# Patient Record
Sex: Male | Born: 1995 | Race: White | Hispanic: No | Marital: Married | Smoking: Never smoker
Health system: Southern US, Community
[De-identification: ages and names within clinical notes are randomized; demographics above are authoritative.]

## PROBLEM LIST (undated history)

## (undated) HISTORY — PX: WISDOM TOOTH EXTRACTION: SHX21

---
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
## 2016-12-21 ENCOUNTER — Ambulatory Visit (INDEPENDENT_AMBULATORY_CARE_PROVIDER_SITE_OTHER): Payer: BC Managed Care – PPO | Admitting: Sports Medicine

## 2016-12-21 ENCOUNTER — Encounter: Payer: Self-pay | Admitting: Sports Medicine

## 2016-12-21 DIAGNOSIS — F341 Dysthymic disorder: Secondary | ICD-10-CM | POA: Diagnosis not present

## 2016-12-21 DIAGNOSIS — Z113 Encounter for screening for infections with a predominantly sexual mode of transmission: Secondary | ICD-10-CM

## 2016-12-21 DIAGNOSIS — Z0000 Encounter for general adult medical examination without abnormal findings: Secondary | ICD-10-CM | POA: Diagnosis not present

## 2016-12-21 DIAGNOSIS — A5601 Chlamydial cystitis and urethritis: Secondary | ICD-10-CM | POA: Insufficient documentation

## 2016-12-21 MED ORDER — ESCITALOPRAM OXALATE 5 MG PO TABS: 5.0000 mg | ORAL_TABLET | Freq: Every day | ORAL | 3 refills | Status: DC

## 2016-12-21 NOTE — Assessment & Plan Note (Signed)
Adding a full STD screen. 

## 2016-12-21 NOTE — Assessment & Plan Note (Signed)
We discussed the pathophysiology mechanism of the medication and the disease process in detail. Starting Lexapro 5, return in one month for a repeat PTQ/GAD.

## 2016-12-21 NOTE — Progress Notes (Signed)
  Subjective:    CC: Establish care.   HPI:  Travis Ward is a pleasant 21 year old male, he is healthy, he has no complaints with the exception of mildly depressed mood without suicidal or homicidal ideation. He is interested in trying medication. Symptoms are mild, intermittent. In addition he is interested in being tested for STDs.  Past medical history:  Negative.  See flowsheet/record as well for more information.  Surgical history: Negative.  See flowsheet/record as well for more information.  Family history: Negative.  See flowsheet/record as well for more information.  Social history: Negative.  See flowsheet/record as well for more information.  Allergies, and medications have been entered into the medical record, reviewed, and no changes needed.    Review of Systems: No headache, visual changes, nausea, vomiting, diarrhea, constipation, dizziness, abdominal pain, skin rash, fevers, chills, night sweats, swollen lymph nodes, weight loss, chest pain, body aches, joint swelling, muscle aches, shortness of breath, mood changes, visual or auditory hallucinations.  Objective:    General: Well Developed, well nourished, and in no acute distress.  Neuro: Alert and oriented x3, extra-ocular muscles intact, sensation grossly intact.  HEENT: Normocephalic, atraumatic, pupils equal round reactive to light, neck supple, no masses, no lymphadenopathy, thyroid nonpalpable.  Skin: Warm and dry, no rashes noted.  Cardiac: Regular rate and rhythm, no murmurs rubs or gallops.  Respiratory: Clear to auscultation bilaterally. Not using accessory muscles, speaking in full sentences.  Abdominal: Soft, nontender, nondistended, positive bowel sounds, no masses, no organomegaly.  Musculoskeletal: Shoulder, elbow, wrist, hip, knee, ankle stable, and with full range of motion.  Impression and Recommendations:    The patient was counselled, risk factors were discussed, anticipatory guidance  given.  Dysthymia We discussed the pathophysiology mechanism of the medication and the disease process in detail. Starting Lexapro 5, return in one month for a repeat PTQ/GAD.  Annual physical exam Checking routine labs, up-to-date on tetanus vaccination, adding flu shot today.   Screening for STD (sexually transmitted disease) Adding a full STD screen.  ___________________________________________ Ihor Austinhomas J. Benjamin Stainhekkekandam, M.D., ABFM., CAQSM. Primary Care and Sports Medicine [facility]  Adjunct Instructor of Family Medicine  [employer]

## 2016-12-21 NOTE — Assessment & Plan Note (Signed)
Checking routine labs, up-to-date on tetanus vaccination, adding flu shot today.

## 2017-01-18 ENCOUNTER — Encounter: Payer: Self-pay | Admitting: Sports Medicine

## 2017-01-18 ENCOUNTER — Ambulatory Visit: Payer: BC Managed Care – PPO | Admitting: Sports Medicine

## 2017-01-18 DIAGNOSIS — A5601 Chlamydial cystitis and urethritis: Secondary | ICD-10-CM | POA: Diagnosis not present

## 2017-01-18 DIAGNOSIS — Z23 Encounter for immunization: Secondary | ICD-10-CM | POA: Diagnosis not present

## 2017-01-18 DIAGNOSIS — F341 Dysthymic disorder: Secondary | ICD-10-CM

## 2017-01-18 MED ORDER — ESCITALOPRAM OXALATE 10 MG PO TABS: 10.0000 mg | ORAL_TABLET | Freq: Every day | ORAL | 3 refills | Status: DC

## 2017-01-18 NOTE — Progress Notes (Addendum)
  Subjective:    CC: Dysthymia follow-up  HPI: This is a pleasant 21 year old male, he had some depressed mood, but not enough to qualify for major depression, we diagnosed him with dysthymia and started Lexapro per his request, 5 mg.  He returns today feeling may be slightly better but overall the same, no suicidal or homicidal ideation.  Preventive measures: Flu shot today, has not yet obtained his routine blood work.  Past medical history:  Negative.  See flowsheet/record as well for more information.  Surgical history: Negative.  See flowsheet/record as well for more information.  Family history: Negative.  See flowsheet/record as well for more information.  Social history: Negative.  See flowsheet/record as well for more information.  Allergies, and medications have been entered into the medical record, reviewed, and no changes needed.   Review of Systems: No fevers, chills, night sweats, weight loss, chest pain, or shortness of breath.   Objective:    General: Well Developed, well nourished, and in no acute distress.  Neuro: Alert and oriented x3, extra-ocular muscles intact, sensation grossly intact.  HEENT: Normocephalic, atraumatic, pupils equal round reactive to light, neck supple, no masses, no lymphadenopathy, thyroid nonpalpable.  Skin: Warm and dry, no rashes. Cardiac: Regular rate and rhythm, no murmurs rubs or gallops, no lower extremity edema.  Respiratory: Clear to auscultation bilaterally. Not using accessory muscles, speaking in full sentences.  Impression and Recommendations:    Dysthymia Slight persistence of dysthymia, and likely seasonal affective disorder. Increasing Lexapro to 10 mg, return in 1 month.  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  I spent 25 minutes with this patient, greater than 50% was face-to-face time  counseling regarding the above diagnoses ___________________________________________ Ihor Austinhomas J. Benjamin Stainhekkekandam, M.D., ABFM., CAQSM. Primary Care and Sports Medicine [facility]  Adjunct Instructor of Family Medicine  [employer]

## 2017-01-18 NOTE — Assessment & Plan Note (Signed)
Slight persistence of dysthymia, and likely seasonal affective disorder. Increasing Lexapro to 10 mg, return in 1 month.

## 2017-01-19 LAB — COMPREHENSIVE METABOLIC PANEL WITH GFR
AG Ratio: 2 (calc) (ref 1.0–2.5)
ALT: 10 U/L (ref 9–46)
AST: 14 U/L (ref 10–40)
CO2: 30 mmol/L (ref 20–32)
Calcium: 9.6 mg/dL (ref 8.6–10.3)
Chloride: 104 mmol/L (ref 98–110)
Globulin: 2.3 g/dL (ref 1.9–3.7)
Sodium: 141 mmol/L (ref 135–146)
Total Bilirubin: 0.7 mg/dL (ref 0.2–1.2)
Total Protein: 7 g/dL (ref 6.1–8.1)

## 2017-01-19 LAB — HEMOGLOBIN A1C
Hgb A1c MFr Bld: 4.9 % of total Hgb (ref <5.7)
Mean Plasma Glucose: 94 (calc)
eAG (mmol/L): 5.2 (calc)

## 2017-01-19 LAB — COMPREHENSIVE METABOLIC PANEL
Albumin: 4.7 g/dL (ref 3.6–5.1)
Alkaline phosphatase (APISO): 60 U/L (ref 40–115)
BUN: 16 mg/dL (ref 7–25)
Creat: 1.03 mg/dL (ref 0.60–1.35)
Glucose, Bld: 85 mg/dL (ref 65–99)
Potassium: 4.3 mmol/L (ref 3.5–5.3)

## 2017-01-19 LAB — LIPID PANEL W/REFLEX DIRECT LDL
Cholesterol: 157 mg/dL (ref <200)
HDL: 45 mg/dL (ref >40)
LDL Cholesterol (Calc): 98 mg/dL
Non-HDL Cholesterol (Calc): 112 mg/dL (calc) (ref <130)
Total CHOL/HDL Ratio: 3.5 (calc) (ref <5.0)
Triglycerides: 55 mg/dL (ref <150)

## 2017-01-19 LAB — HEPATITIS PANEL, ACUTE
Hep A IgM: NONREACTIVE
Hep B C IgM: NONREACTIVE
Hepatitis B Surface Ag: NONREACTIVE
Hepatitis C Ab: NONREACTIVE
SIGNAL TO CUT-OFF: 0.02 (ref <1.00)

## 2017-01-19 LAB — CBC
HCT: 43.3 % (ref 38.5–50.0)
Hemoglobin: 15 g/dL (ref 13.2–17.1)
MCH: 29.1 pg (ref 27.0–33.0)
MCHC: 34.6 g/dL (ref 32.0–36.0)
MCV: 83.9 fL (ref 80.0–100.0)
MPV: 11 fL (ref 7.5–12.5)
Platelets: 218 10*3/uL (ref 140–400)
RBC: 5.16 Million/uL (ref 4.20–5.80)
RDW: 12.5 % (ref 11.0–15.0)
WBC: 5.3 10*3/uL (ref 3.8–10.8)

## 2017-01-19 LAB — HSV 2 ANTIBODY, IGG: HSV 2 Glycoprotein G Ab, IgG: <0.9 {index}

## 2017-01-19 LAB — TSH: TSH: 0.81 mIU/L (ref 0.40–4.50)

## 2017-01-19 LAB — VITAMIN D 25 HYDROXY (VIT D DEFICIENCY, FRACTURES): Vit D, 25-Hydroxy: 44 ng/mL (ref 30–100)

## 2017-01-19 LAB — HIV ANTIBODY (ROUTINE TESTING W REFLEX): HIV 1&2 Ab, 4th Generation: NONREACTIVE

## 2017-01-20 ENCOUNTER — Ambulatory Visit (INDEPENDENT_AMBULATORY_CARE_PROVIDER_SITE_OTHER): Payer: BC Managed Care – PPO | Admitting: Sports Medicine

## 2017-01-20 ENCOUNTER — Encounter: Payer: Self-pay | Admitting: Sports Medicine

## 2017-01-20 VITALS — BP 134/75 | HR 82 | Resp 16 | Wt 201.0 lb

## 2017-01-20 DIAGNOSIS — A5601 Chlamydial cystitis and urethritis: Secondary | ICD-10-CM

## 2017-01-20 LAB — C. TRACHOMATIS/N. GONORRHOEAE RNA
C. trachomatis RNA, TMA: DETECTED — AB
N. gonorrhoeae RNA, TMA: NOT DETECTED

## 2017-01-20 MED ORDER — AZITHROMYCIN 250 MG PO TABS
1000.0000 mg | ORAL_TABLET | Freq: Once | ORAL | Status: AC
  Administered 2017-01-20: 1000 mg via ORAL

## 2017-01-20 NOTE — Assessment & Plan Note (Signed)
Chlamydia has come back positive, needs to go ahead and come in for a nurse visit for azithromycin slurry 1 g, and then 4 weeks for a test of cure.

## 2017-01-23 NOTE — Progress Notes (Signed)
Azithromycin slurry for Chlamydia

## 2017-02-15 ENCOUNTER — Encounter: Payer: Self-pay | Admitting: Sports Medicine

## 2017-02-15 ENCOUNTER — Ambulatory Visit (INDEPENDENT_AMBULATORY_CARE_PROVIDER_SITE_OTHER): Payer: BC Managed Care – PPO | Admitting: Sports Medicine

## 2017-02-15 DIAGNOSIS — A5601 Chlamydial cystitis and urethritis: Secondary | ICD-10-CM | POA: Diagnosis not present

## 2017-02-15 DIAGNOSIS — F341 Dysthymic disorder: Secondary | ICD-10-CM | POA: Diagnosis not present

## 2017-02-15 NOTE — Assessment & Plan Note (Signed)
Fantastic response to increasing to 10 mg of Lexapro. We will probably continue this until the day start to get a lot longer and then discontinue.   I do think this is somewhat of a combination of dysthymia versus simple seasonal affective disorder.

## 2017-02-15 NOTE — Progress Notes (Signed)
  Subjective:    CC: Recheck mood, chlamydia  HPI: Chlamydia urethritis: Azithromycin slurry was given 1 month ago, symptoms have resolved.  Agreeable to do test of cure today.  Seasonal affective disorder/dysthymia: Fantastic improvement with increasing to 10 mg of Lexapro, no suicidal or homicidal ideation.  Past medical history:  Negative.  See flowsheet/record as well for more information.  Surgical history: Negative.  See flowsheet/record as well for more information.  Family history: Negative.  See flowsheet/record as well for more information.  Social history: Negative.  See flowsheet/record as well for more information.  Allergies, and medications have been entered into the medical record, reviewed, and no changes needed.   (To billers/coders, pertinent past medical, social, surgical, family history can be found in problem list, if problem list is marked as reviewed then this indicates that past medical, social, surgical, family history was also reviewed)  Review of Systems: No fevers, chills, night sweats, weight loss, chest pain, or shortness of breath.   Objective:    General: Well Developed, well nourished, and in no acute distress.  Neuro: Alert and oriented x3, extra-ocular muscles intact, sensation grossly intact.  HEENT: Normocephalic, atraumatic, pupils equal round reactive to light, neck supple, no masses, no lymphadenopathy, thyroid nonpalpable.  Skin: Warm and dry, no rashes. Cardiac: Regular rate and rhythm, no murmurs rubs or gallops, no lower extremity edema.  Respiratory: Clear to auscultation bilaterally. Not using accessory muscles, speaking in full sentences.  Impression and Recommendations:    Dysthymia Fantastic response to increasing to 10 mg of Lexapro. We will probably continue this until the day start to get a lot longer and then discontinue.   I do think this is somewhat of a combination of dysthymia versus simple seasonal affective  disorder.  Chlamydial urethritis in male Azithromycin slurry 1 month ago, test of cure today.  ___________________________________________ Ihor Austinhomas J. Benjamin Stainhekkekandam, M.D., ABFM., CAQSM. Primary Care and Sports Medicine [facility]  Adjunct Instructor of Family Medicine  [employer]

## 2017-02-15 NOTE — Assessment & Plan Note (Signed)
Azithromycin slurry 1 month ago, test of cure today.

## 2017-02-16 LAB — C. TRACHOMATIS/N. GONORRHOEAE RNA
C. trachomatis RNA, TMA: NOT DETECTED
N. gonorrhoeae RNA, TMA: NOT DETECTED

## 2017-02-23 ENCOUNTER — Other Ambulatory Visit: Payer: Self-pay

## 2017-02-23 DIAGNOSIS — F341 Dysthymic disorder: Secondary | ICD-10-CM

## 2017-02-23 MED ORDER — ESCITALOPRAM OXALATE 10 MG PO TABS: 10.0000 mg | ORAL_TABLET | Freq: Every day | ORAL | 1 refills | Status: DC

## 2017-04-17 ENCOUNTER — Emergency Department
Admission: EM | Admit: 2017-04-17 | Discharge: 2017-04-17 | Discharge status: Absent without leave | Payer: BC Managed Care – PPO | Source: Home / Self Care

## 2017-04-17 ENCOUNTER — Ambulatory Visit (INDEPENDENT_AMBULATORY_CARE_PROVIDER_SITE_OTHER): Payer: BC Managed Care – PPO

## 2017-04-17 ENCOUNTER — Encounter: Payer: Self-pay | Admitting: Family Medicine

## 2017-04-17 ENCOUNTER — Ambulatory Visit (INDEPENDENT_AMBULATORY_CARE_PROVIDER_SITE_OTHER): Payer: BC Managed Care – PPO | Admitting: Family Medicine

## 2017-04-17 VITALS — BP 124/74 | HR 56 | Ht 75.0 in | Wt 204.0 lb

## 2017-04-17 DIAGNOSIS — M25512 Pain in left shoulder: Secondary | ICD-10-CM

## 2017-04-17 MED ORDER — DICLOFENAC SODIUM 1 % TD GEL: 2.0000 g | Freq: Four times a day (QID) | TRANSDERMAL | 11 refills | Status: DC

## 2017-04-17 MED ORDER — NAPROXEN 500 MG PO TABS: 500.0000 mg | ORAL_TABLET | Freq: Two times a day (BID) | ORAL | 0 refills | Status: DC

## 2017-04-17 NOTE — Progress Notes (Signed)
Travis Ward is a 22 y.o. male who presents to [facility] today for left shoulder pain. Patient suffered fall onto left shoulder yesterday after he jumped a fence. Patient describes injury as forced adduction with arm tucked into body. The fall was not on an outstretched arm. Patient did have pain immediately but it was not intolerable. Patient was achy both superficially over [facility] joint and deep within the shoulder yesterday. Pain worsened yesterday evening. Any movement of shoulder is limited due to pain. Patient says active abduction is worst. Flexion is also limited due to pain. Patient has been using alternating heat and ice for pain relief. Not taking any medication for pain.   Patient has not had shoulder issues in the past.  No past medical history on file. Past Surgical History:  Procedure Laterality Date  . WISDOM TOOTH EXTRACTION     Social History   Tobacco Use  . Smoking status: Never Smoker  . Smokeless tobacco: Former Neurosurgeon    Types: Chew  Substance Use Topics  . Alcohol use: Yes    Alcohol/week: 0.0 oz   ROS:  As above  Medications: Current Outpatient Medications  Medication Sig Dispense Refill  . escitalopram (LEXAPRO) 10 MG tablet Take 1 tablet (10 mg total) by mouth daily. 90 tablet 1  . diclofenac sodium (VOLTAREN) 1 % GEL Apply 2 g topically 4 (four) times daily. To affected joint. 100 g 11  . naproxen (NAPROSYN) 500 MG tablet Take 1 tablet (500 mg total) by mouth 2 (two) times daily with a meal. 30 tablet 0   No current facility-administered medications for this visit.    No Known Allergies  Exam:  BP 124/74   Pulse (!) 56   Ht 6\' 3"  (1.905 m)   Wt 204 lb (92.5 kg)   BMI 25.50 kg/m  General: Well Developed, well nourished, and in no acute distress.  Neuro/Psych: Alert and oriented x3, extra-ocular muscles intact, able to move all 4 extremities, sensation grossly intact. Skin: Warm and dry, no rashes noted.   Respiratory: Not using accessory muscles, speaking in full sentences, trachea midline.  Cardiovascular: Pulses palpable, no extremity edema. Abdomen: Does not appear distended. MSK:  L. Shoulder:  Normal in appearance without abrasion, erythema, or obvious deformity. Patient tender over distal clavicle and AC joint.  Active ROM limited to 30 degrees on flexion and abduction. Internal rotation normal without pain. Specific testing limited due to pain.  Shoulder Xray: Independently reviewed. No obvious fracture. No obvious AC joint separation. Will await further radiology review.   Limited musculoskeletal ultrasound of the left shoulder reveals normal-appearing acromioclavicular joint.  Normal bicipital tendon in the groove normal subscapularis supraspinatus and infraspinatus tendons.  No obvious fractures are present. Normal shoulder ultrasound  No results found for this or any previous visit (from the past 48 hour(s)). Dg Shoulder Left  Result Date: 04/17/2017 CLINICAL DATA:  Left shoulder pain following fall 2 days ago, initial encounter EXAM: LEFT SHOULDER - 2+ VIEW COMPARISON:  None. FINDINGS: There is no evidence of fracture or dislocation. There is no evidence of arthropathy or other focal bone abnormality. Soft tissues are unremarkable. IMPRESSION: No acute abnormality noted. Electronically Signed   By: Alcide Clever M.D.   On: 04/17/2017 14:44    Assessment and Plan: 22 y.o. male with left shoulder pain. Patient's findings most consistent with rotator cuff syndrome vs partial rotator cuff tear. Patient's xray not consistent with fracture and point of care  US rules out complete tear of rotator cuff tendon.   Patient will likely improve in the coming days. Patient prescribed naproxen and diclofenac gel for pain management. He was also given home exercises to perform in the coming days. No formal physical therapy indicated at this time.   Patient should see improvement in coming  days. If not improving by weekend, patient should return to escalate care and consideration of advanced imaging.   Orders Placed This Encounter  Procedures  . DG Shoulder Left    Standing Status:   Future    Number of Occurrences:   1    Standing Expiration Date:   06/16/2018    Order Specific Question:   Reason for Exam (SYMPTOM  OR DIAGNOSIS REQUIRED)    Answer:   fell on left shoulder yesterday    Order Specific Question:   Preferred imaging location?    Answer[facility]    Order Specific Question:   Radiology Contrast Protocol - do NOT remove file path    Answer:   [email or web address]   Meds ordered this encounter  Medications  . naproxen (NAPROSYN) 500 MG tablet    Sig: Take 1 tablet (500 mg total) by mouth 2 (two) times daily with a meal.    Dispense:  30 tablet    Refill:  0  . diclofenac sodium (VOLTAREN) 1 % GEL    Sig: Apply 2 g topically 4 (four) times daily. To affected joint.    Dispense:  100 g    Refill:  11    Discussed warning signs or symptoms. Please see discharge instructions. Patient expresses understanding.

## 2017-04-17 NOTE — Patient Instructions (Signed)
Thank you for coming in today. Take easy for a few days.  Work on range of motion exercises.  Start the band exercises when you can.  Take naproxen twice daily for pain as needed.  Use the diclofenac gel 4x daily for pain as needed.  Recheck with Dr T or me in 1 week if not doing well.    Shoulder Impingement Syndrome Rehab Ask your health care provider which exercises are safe for you. Do exercises exactly as told by your health care provider and adjust them as directed. It is normal to feel mild stretching, pulling, tightness, or discomfort as you do these exercises, but you should stop right away if you feel sudden pain or your pain gets worse.Do not begin these exercises until told by your health care provider. Stretching and range of motion exercise This exercise warms up your muscles and joints and improves the movement and flexibility of your shoulder. This exercise also helps to relieve pain and stiffness. Exercise A: Passive horizontal adduction  1. Sit or stand and pull your left / right elbow across your chest, toward your other shoulder. Stop when you feel a gentle stretch in the back of your shoulder and upper arm. ? Keep your arm at shoulder height. ? Keep your arm as close to your body as you comfortably can. 2. Hold for __________ seconds. 3. Slowly return to the starting position. Repeat __________ times. Complete this exercise __________ times a day. Strengthening exercises These exercises build strength and endurance in your shoulder. Endurance is the ability to use your muscles for a long time, even after they get tired. Exercise B: External rotation, isometric 1. Stand or sit in a doorway, facing the door frame. 2. Bend your left / right elbow and place the back of your wrist against the door frame. Only your wrist should be touching the frame. Keep your upper arm at your side. 3. Gently press your wrist against the door frame, as if you are trying to push your arm  away from your abdomen. ? Avoid shrugging your shoulder while you press your hand against the door frame. Keep your shoulder blade tucked down toward the middle of your back. 4. Hold for __________ seconds. 5. Slowly release the tension, and relax your muscles completely before you do the exercise again. Repeat __________ times. Complete this exercise __________ times a day. Exercise C: Internal rotation, isometric  1. Stand or sit in a doorway, facing the door frame. 2. Bend your left / right elbow and place the inside of your wrist against the door frame. Only your wrist should be touching the frame. Keep your upper arm at your side. 3. Gently press your wrist against the door frame, as if you are trying to push your arm toward your abdomen. ? Avoid shrugging your shoulder while you press your hand against the door frame. Keep your shoulder blade tucked down toward the middle of your back. 4. Hold for __________ seconds. 5. Slowly release the tension, and relax your muscles completely before you do the exercise again. Repeat __________ times. Complete this exercise __________ times a day. Exercise D: Scapular protraction, supine  1. Lie on your back on a firm surface. Hold a __________ weight in your left / right hand. 2. Raise your left / right arm straight into the air so your hand is directly above your shoulder joint. 3. Push the weight into the air so your shoulder lifts off of the surface that you are lying  on. Do not move your head, neck, or back. 4. Hold for __________ seconds. 5. Slowly return to the starting position. Let your muscles relax completely before you repeat this exercise. Repeat __________ times. Complete this exercise __________ times a day. Exercise E: Scapular retraction  1. Sit in a stable chair without armrests, or stand. 2. Secure an exercise band to a stable object in front of you so the band is at shoulder height. 3. Hold one end of the exercise band in each  hand. Your palms should face down. 4. Squeeze your shoulder blades together and move your elbows slightly behind you. Do not shrug your shoulders while you do this. 5. Hold for __________ seconds. 6. Slowly return to the starting position. Repeat __________ times. Complete this exercise __________ times a day. Exercise F: Shoulder extension  1. Sit in a stable chair without armrests, or stand. 2. Secure an exercise band to a stable object in front of you where the band is above shoulder height. 3. Hold one end of the exercise band in each hand. 4. Straighten your elbows and lift your hands up to shoulder height. 5. Squeeze your shoulder blades together and pull your hands down to the sides of your thighs. Stop when your hands are straight down by your sides. Do not let your hands go behind your body. 6. Hold for __________ seconds. 7. Slowly return to the starting position. Repeat __________ times. Complete this exercise __________ times a day. This information is not intended to replace advice given to you by your health care provider. Make sure you discuss any questions you have with your health care provider. Document Released: 02/21/2005 Document Revised: 10/29/2015 Document Reviewed: 01/24/2015 Elsevier Interactive Patient Education  [employer]2018 Elsevier Inc.

## 2017-05-16 ENCOUNTER — Ambulatory Visit: Payer: BC Managed Care – PPO | Admitting: Sports Medicine

## 2017-05-16 DIAGNOSIS — Z0189 Encounter for other specified special examinations: Secondary | ICD-10-CM

## 2018-07-16 ENCOUNTER — Ambulatory Visit (INDEPENDENT_AMBULATORY_CARE_PROVIDER_SITE_OTHER): Payer: BC Managed Care – PPO | Admitting: Sports Medicine

## 2018-07-16 ENCOUNTER — Other Ambulatory Visit: Payer: Self-pay

## 2018-07-16 ENCOUNTER — Ambulatory Visit (INDEPENDENT_AMBULATORY_CARE_PROVIDER_SITE_OTHER): Payer: BC Managed Care – PPO

## 2018-07-16 ENCOUNTER — Encounter: Payer: Self-pay | Admitting: Sports Medicine

## 2018-07-16 DIAGNOSIS — M79674 Pain in right toe(s): Secondary | ICD-10-CM

## 2018-07-16 MED ORDER — PREDNISONE 50 MG PO TABS
ORAL_TABLET | ORAL | 0 refills | Status: DC
Start: 2018-07-16 — End: 2018-07-27

## 2018-07-16 NOTE — Progress Notes (Signed)
Subjective:    CC: Toe pain  HPI: This is a pleasant 23 year old male, for the past several days has had increasing pain in his right fourth toe, he has been drinking more alcohol and does have a family history of gout, otherwise no trauma, fevers, chills, no penile lesions or discharge.  Symptoms are moderate, persistent, localized without radiation.  I reviewed the past medical history, family history, social history, surgical history, and allergies today and no changes were needed.  Please see the problem list section below in epic for further details.  Past Medical History: No past medical history on file. Past Surgical History: Past Surgical History:  Procedure Laterality Date  . WISDOM TOOTH EXTRACTION     Social History: Social History   Socioeconomic History  . Marital status: Single    Spouse name: Not on file  . Number of children: Not on file  . Years of education: Not on file  . Highest education level: Not on file  Occupational History  . Not on file  Social Needs  . Financial resource strain: Not on file  . Food insecurity:    Worry: Not on file    Inability: Not on file  . Transportation needs:    Medical: Not on file    Non-medical: Not on file  Tobacco Use  . Smoking status: Never Smoker  . Smokeless tobacco: Former NeurosurgeonUser    Types: Chew  Substance and Sexual Activity  . Alcohol use: Yes    Alcohol/week: 0.0 standard drinks  . Drug use: No  . Sexual activity: Yes    Partners: Female  Lifestyle  . Physical activity:    Days per week: Not on file    Minutes per session: Not on file  . Stress: Not on file  Relationships  . Social connections:    Talks on phone: Not on file    Gets together: Not on file    Attends religious service: Not on file    Active member of club or organization: Not on file    Attends meetings of clubs or organizations: Not on file    Relationship status: Not on file  Other Topics Concern  . Not on file  Social History  Narrative  . Not on file   Family History: Family History  Problem Relation Age of Onset  . Heart attack Unknown        grandparents  . Diabetes Unknown        grandparents  . Hyperlipidemia Unknown        grandparents  . Stroke Unknown        grandparent    Allergies: No Known Allergies Medications: See med rec.  Review of Systems: No fevers, chills, night sweats, weight loss, chest pain, or shortness of breath.   Objective:    General: Well Developed, well nourished, and in no acute distress.  Neuro: Alert and oriented x3, extra-ocular muscles intact, sensation grossly intact.  HEENT: Normocephalic, atraumatic, pupils equal round reactive to light, neck supple, no masses, no lymphadenopathy, thyroid nonpalpable.  Skin: Warm and dry, no rashes. Cardiac: Regular rate and rhythm, no murmurs rubs or gallops, no lower extremity edema.  Respiratory: Clear to auscultation bilaterally. Not using accessory muscles, speaking in full sentences. Right foot: Minimally swollen, red fourth DIP.  Not really hot.  X-rays personally reviewed, he does have a subchondral cyst versus marginal erosion into the distal phalanx.  Impression and Recommendations:    Toe pain, right Painful at the  right fourth DIP. Minimal swelling. Unclear etiology, we are to get x-rays, and 5 days of prednisone. Differential is broad and includes crystalline arthropathy, trauma, osteoarthritis, gonococcal osteoarthritis, as well as paronychia/eponychia. Adding 5 days of prednisone, x-rays, labs including uric acid levels. Return to see me in a couple of weeks, we will do a course of antibiotics if not better.  X-rays personally reviewed, he does have a subchondral cyst versus marginal erosion into the distal phalanx. If persistent discomfort or recurrence then we will do a full rheumatoid work-up.   ___________________________________________ Ihor Austin. Benjamin Stain, M.D., ABFM., CAQSM. Primary Care and  Sports Medicine [facility]  Adjunct Professor of Family Medicine  [employer]

## 2018-07-16 NOTE — Assessment & Plan Note (Addendum)
Painful at the right fourth DIP. Minimal swelling. Unclear etiology, we are to get x-rays, and 5 days of prednisone. Differential is broad and includes crystalline arthropathy, trauma, osteoarthritis, gonococcal osteoarthritis, as well as paronychia/eponychia. Adding 5 days of prednisone, x-rays, labs including uric acid levels. Return to see me in a couple of weeks, we will do a course of antibiotics if not better.  X-rays personally reviewed, he does have a subchondral cyst versus marginal erosion into the distal phalanx. If persistent discomfort or recurrence then we will do a full rheumatoid work-up.

## 2018-07-27 ENCOUNTER — Encounter: Payer: Self-pay | Admitting: Sports Medicine

## 2018-07-27 ENCOUNTER — Ambulatory Visit (INDEPENDENT_AMBULATORY_CARE_PROVIDER_SITE_OTHER): Payer: BC Managed Care – PPO | Admitting: Sports Medicine

## 2018-07-27 DIAGNOSIS — M79674 Pain in right toe(s): Secondary | ICD-10-CM | POA: Diagnosis not present

## 2018-07-27 NOTE — Assessment & Plan Note (Signed)
Painful right fourth DIP has resolved after 5 days of prednisone. He did have what looked like a subchondral cyst versus a marginal erosion in the distal phalanx on x-rays. We had ordered labs but these were never done, if this recurs we would do a full rheumatoid work-up including uric acid levels. Because symptoms have completely resolved, return as needed.

## 2018-07-27 NOTE — Progress Notes (Signed)
Subjective:    CC: Follow-up  HPI: Right fourth DIP synovitis: Resolved with prednisone.  I reviewed the past medical history, family history, social history, surgical history, and allergies today and no changes were needed.  Please see the problem list section below in epic for further details.  Past Medical History: No past medical history on file. Past Surgical History: Past Surgical History:  Procedure Laterality Date  . WISDOM TOOTH EXTRACTION     Social History: Social History   Socioeconomic History  . Marital status: Single    Spouse name: Not on file  . Number of children: Not on file  . Years of education: Not on file  . Highest education level: Not on file  Occupational History  . Not on file  Social Needs  . Financial resource strain: Not on file  . Food insecurity:    Worry: Not on file    Inability: Not on file  . Transportation needs:    Medical: Not on file    Non-medical: Not on file  Tobacco Use  . Smoking status: Never Smoker  . Smokeless tobacco: Former Neurosurgeon    Types: Chew  Substance and Sexual Activity  . Alcohol use: Yes    Alcohol/week: 0.0 standard drinks  . Drug use: No  . Sexual activity: Yes    Partners: Female  Lifestyle  . Physical activity:    Days per week: Not on file    Minutes per session: Not on file  . Stress: Not on file  Relationships  . Social connections:    Talks on phone: Not on file    Gets together: Not on file    Attends religious service: Not on file    Active member of club or organization: Not on file    Attends meetings of clubs or organizations: Not on file    Relationship status: Not on file  Other Topics Concern  . Not on file  Social History Narrative  . Not on file   Family History: Family History  Problem Relation Age of Onset  . Heart attack Unknown        grandparents  . Diabetes Unknown        grandparents  . Hyperlipidemia Unknown        grandparents  . Stroke Unknown    grandparent    Allergies: No Known Allergies Medications: See med rec.  Review of Systems: No fevers, chills, night sweats, weight loss, chest pain, or shortness of breath.   Objective:    General: Well Developed, well nourished, and in no acute distress.  Neuro: Alert and oriented x3, extra-ocular muscles intact, sensation grossly intact.  HEENT: Normocephalic, atraumatic, pupils equal round reactive to light, neck supple, no masses, no lymphadenopathy, thyroid nonpalpable.  Skin: Warm and dry, no rashes. Cardiac: Regular rate and rhythm, no murmurs rubs or gallops, no lower extremity edema.  Respiratory: Clear to auscultation bilaterally. Not using accessory muscles, speaking in full sentences. Right foot: No visible erythema or swelling. Range of motion is full in all directions. Strength is 5/5 in all directions. No hallux valgus. No pes cavus or pes planus. No abnormal callus noted. No pain over the navicular prominence, or base of fifth metatarsal. No tenderness to palpation of the calcaneal insertion of plantar fascia. No pain at the Achilles insertion. No pain over the calcaneal bursa. No pain of the retrocalcaneal bursa. No tenderness to palpation over the tarsals, metatarsals, or phalanges. No hallux rigidus or limitus. No tenderness palpation  over interphalangeal joints. No pain with compression of the metatarsal heads. Neurovascularly intact distally.  Impression and Recommendations:    Toe pain, right Painful right fourth DIP has resolved after 5 days of prednisone. He did have what looked like a subchondral cyst versus a marginal erosion in the distal phalanx on x-rays. We had ordered labs but these were never done, if this recurs we would do a full rheumatoid work-up including uric acid levels. Because symptoms have completely resolved, return as needed.   ___________________________________________ Ihor Austinhomas J. Benjamin Stainhekkekandam, M.D., ABFM., CAQSM. Primary Care  and Sports Medicine [facility]  Adjunct Professor of Family Medicine  [employer]

## 2018-08-03 ENCOUNTER — Ambulatory Visit: Payer: BC Managed Care – PPO | Admitting: Sports Medicine

## 2019-02-21 ENCOUNTER — Ambulatory Visit (INDEPENDENT_AMBULATORY_CARE_PROVIDER_SITE_OTHER): Payer: BC Managed Care – PPO | Admitting: Sports Medicine

## 2019-02-21 ENCOUNTER — Other Ambulatory Visit: Payer: Self-pay

## 2019-02-21 ENCOUNTER — Encounter: Payer: Self-pay | Admitting: Sports Medicine

## 2019-02-21 VITALS — BP 114/76 | HR 93 | Ht 70.0 in | Wt 202.0 lb

## 2019-02-21 DIAGNOSIS — Z23 Encounter for immunization: Secondary | ICD-10-CM

## 2019-02-21 DIAGNOSIS — A5601 Chlamydial cystitis and urethritis: Secondary | ICD-10-CM | POA: Diagnosis not present

## 2019-02-21 DIAGNOSIS — Z0000 Encounter for general adult medical examination without abnormal findings: Secondary | ICD-10-CM | POA: Diagnosis not present

## 2019-02-21 NOTE — Assessment & Plan Note (Signed)
Routine physical as above, flu and tetanus today. Checking routine labs.

## 2019-02-21 NOTE — Assessment & Plan Note (Signed)
Recent exposure. Adding a full STD screen.

## 2019-02-21 NOTE — Progress Notes (Signed)
Subjective:    CC: Annual physical  HPI:  This is a pleasant 23 year old male here for his physical, he has no complaints with the exception of a possible exposure to chlamydia.  I reviewed the past medical history, family history, social history, surgical history, and allergies today and no changes were needed.  Please see the problem list section below in epic for further details.  Past Medical History: No past medical history on file. Past Surgical History: Past Surgical History:  Procedure Laterality Date  . WISDOM TOOTH EXTRACTION     Social History: Social History   Socioeconomic History  . Marital status: Single    Spouse name: Not on file  . Number of children: Not on file  . Years of education: Not on file  . Highest education level: Not on file  Occupational History  . Not on file  Tobacco Use  . Smoking status: Never Smoker  . Smokeless tobacco: Former Systems developer    Types: Chew  Substance and Sexual Activity  . Alcohol use: Yes    Alcohol/week: 0.0 standard drinks  . Drug use: No  . Sexual activity: Yes    Partners: Female  Other Topics Concern  . Not on file  Social History Narrative  . Not on file   Social Determinants of Health   Financial Resource Strain:   . Difficulty of Paying Living Expenses: Not on file  Food Insecurity:   . Worried About Charity fundraiser in the Last Year: Not on file  . Ran Out of Food in the Last Year: Not on file  Transportation Needs:   . Lack of Transportation (Medical): Not on file  . Lack of Transportation (Non-Medical): Not on file  Physical Activity:   . Days of Exercise per Week: Not on file  . Minutes of Exercise per Session: Not on file  Stress:   . Feeling of Stress : Not on file  Social Connections:   . Frequency of Communication with Friends and Family: Not on file  . Frequency of Social Gatherings with Friends and Family: Not on file  . Attends Religious Services: Not on file  . Active Member of Clubs or  Organizations: Not on file  . Attends Archivist Meetings: Not on file  . Marital Status: Not on file   Family History: Family History  Problem Relation Age of Onset  . Heart attack Unknown        grandparents  . Diabetes Unknown        grandparents  . Hyperlipidemia Unknown        grandparents  . Stroke Unknown        grandparent    Allergies: No Known Allergies Medications: See med rec.  Review of Systems: No headache, visual changes, nausea, vomiting, diarrhea, constipation, dizziness, abdominal pain, skin rash, fevers, chills, night sweats, swollen lymph nodes, weight loss, chest pain, body aches, joint swelling, muscle aches, shortness of breath, mood changes, visual or auditory hallucinations.  Objective:    General: Well Developed, well nourished, and in no acute distress.  Neuro: Alert and oriented x3, extra-ocular muscles intact, sensation grossly intact. Cranial nerves II through XII are intact, motor, sensory, and coordinative functions are all intact. HEENT: Normocephalic, atraumatic, pupils equal round reactive to light, neck supple, no masses, no lymphadenopathy, thyroid nonpalpable. Oropharynx, nasopharynx, external ear canals are unremarkable. Skin: Warm and dry, no rashes noted.  Cardiac: Regular rate and rhythm, no murmurs rubs or gallops.  Respiratory: Clear to  auscultation bilaterally. Not using accessory muscles, speaking in full sentences.  Abdominal: Soft, nontender, nondistended, positive bowel sounds, no masses, no organomegaly.  Musculoskeletal: Shoulder, elbow, wrist, hip, knee, ankle stable, and with full range of motion.  Impression and Recommendations:    The patient was counselled, risk factors were discussed, anticipatory guidance given.  Annual physical exam Routine physical as above, flu and tetanus today. Checking routine labs.  Chlamydial urethritis in male Recent exposure. Adding a full STD  screen.   ___________________________________________ Ihor Austin. Benjamin Stain, M.D., ABFM., CAQSM. Primary Care and Sports Medicine [facility]  Adjunct Professor of Family Medicine  [employer]

## 2019-02-27 LAB — COMPLETE METABOLIC PANEL WITH GFR
AG Ratio: 2 (calc) (ref 1.0–2.5)
ALT: 11 U/L (ref 9–46)
AST: 15 U/L (ref 10–40)
Albumin: 4.8 g/dL (ref 3.6–5.1)
Alkaline phosphatase (APISO): 57 U/L (ref 36–130)
BUN: 15 mg/dL (ref 7–25)
CO2: 29 mmol/L (ref 20–32)
Calcium: 9.6 mg/dL (ref 8.6–10.3)
Chloride: 103 mmol/L (ref 98–110)
Creat: 1.04 mg/dL (ref 0.60–1.35)
GFR, Est African American: 118 mL/min/{1.73_m2} (ref >=60)
GFR, Est Non African American: 101 mL/min/{1.73_m2} (ref >=60)
Globulin: 2.4 g/dL (calc) (ref 1.9–3.7)
Glucose, Bld: 92 mg/dL (ref 65–99)
Potassium: 4.3 mmol/L (ref 3.5–5.3)
Sodium: 141 mmol/L (ref 135–146)
Total Bilirubin: 0.8 mg/dL (ref 0.2–1.2)
Total Protein: 7.2 g/dL (ref 6.1–8.1)

## 2019-02-27 LAB — CBC
HCT: 43.2 % (ref 38.5–50.0)
Hemoglobin: 15 g/dL (ref 13.2–17.1)
MCH: 29.8 pg (ref 27.0–33.0)
MCHC: 34.7 g/dL (ref 32.0–36.0)
MCV: 85.7 fL (ref 80.0–100.0)
MPV: 11.2 fL (ref 7.5–12.5)
Platelets: 202 10*3/uL (ref 140–400)
RBC: 5.04 10*6/uL (ref 4.20–5.80)
RDW: 12.9 % (ref 11.0–15.0)
WBC: 5.3 10*3/uL (ref 3.8–10.8)

## 2019-02-27 LAB — HEMOGLOBIN A1C
Hgb A1c MFr Bld: 4.7 % of total Hgb (ref <5.7)
Mean Plasma Glucose: 88 (calc)
eAG (mmol/L): 4.9 (calc)

## 2019-02-27 LAB — HEPATITIS PANEL, ACUTE
Hep A IgM: NONREACTIVE
Hep B C IgM: NONREACTIVE
Hepatitis B Surface Ag: NONREACTIVE
Hepatitis C Ab: NONREACTIVE
SIGNAL TO CUT-OFF: 0.03 (ref <1.00)

## 2019-02-27 LAB — RPR: RPR Ser Ql: REACTIVE — AB

## 2019-02-27 LAB — HIV ANTIBODY (ROUTINE TESTING W REFLEX): HIV 1&2 Ab, 4th Generation: NONREACTIVE

## 2019-02-27 LAB — LIPID PANEL W/REFLEX DIRECT LDL
Cholesterol: 170 mg/dL (ref <200)
HDL: 46 mg/dL (ref >=40)
LDL Cholesterol (Calc): 106 mg/dL (calc) — ABNORMAL HIGH
Non-HDL Cholesterol (Calc): 124 mg/dL (calc) (ref <130)
Total CHOL/HDL Ratio: 3.7 (calc) (ref <5.0)
Triglycerides: 85 mg/dL (ref <150)

## 2019-02-27 LAB — HSV 2 ANTIBODY, IGG: HSV 2 Glycoprotein G Ab, IgG: <0.9 index

## 2019-02-27 LAB — RPR TITER: RPR Titer: 1:1 {titer} — ABNORMAL HIGH

## 2019-02-27 LAB — FLUORESCENT TREPONEMAL AB(FTA)-IGG-BLD: Fluorescent Treponemal ABS: NONREACTIVE

## 2019-02-27 LAB — TSH: TSH: 1.23 mIU/L (ref 0.40–4.50)

## 2019-02-27 LAB — VITAMIN D 25 HYDROXY (VIT D DEFICIENCY, FRACTURES): Vit D, 25-Hydroxy: 34 ng/mL (ref 30–100)

## 2019-04-03 ENCOUNTER — Ambulatory Visit: Payer: BC Managed Care – PPO | Attending: Internal Medicine

## 2019-04-03 DIAGNOSIS — Z20822 Contact with and (suspected) exposure to covid-19: Secondary | ICD-10-CM

## 2019-04-04 LAB — NOVEL CORONAVIRUS, NAA: SARS-CoV-2, NAA: NOT DETECTED

## 2020-10-18 IMAGING — DX RIGHT FOURTH TOE
3 series · 3 of 3 positions shown · non-contrast
Comparison: None.

CLINICAL DATA: Soft tissue swelling and pain

EXAM:
RIGHT FOURTH TOE: 3 VIEWS

[toe ap]
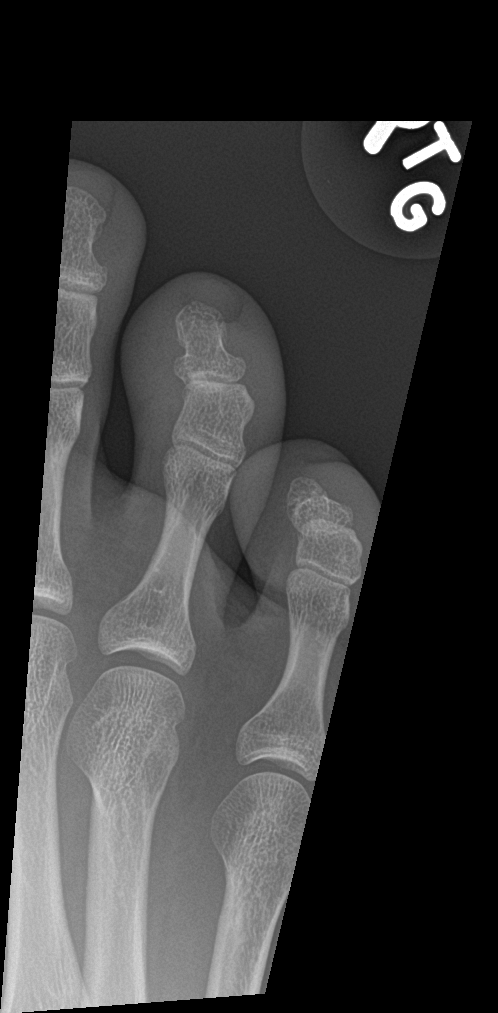

[toe obl]
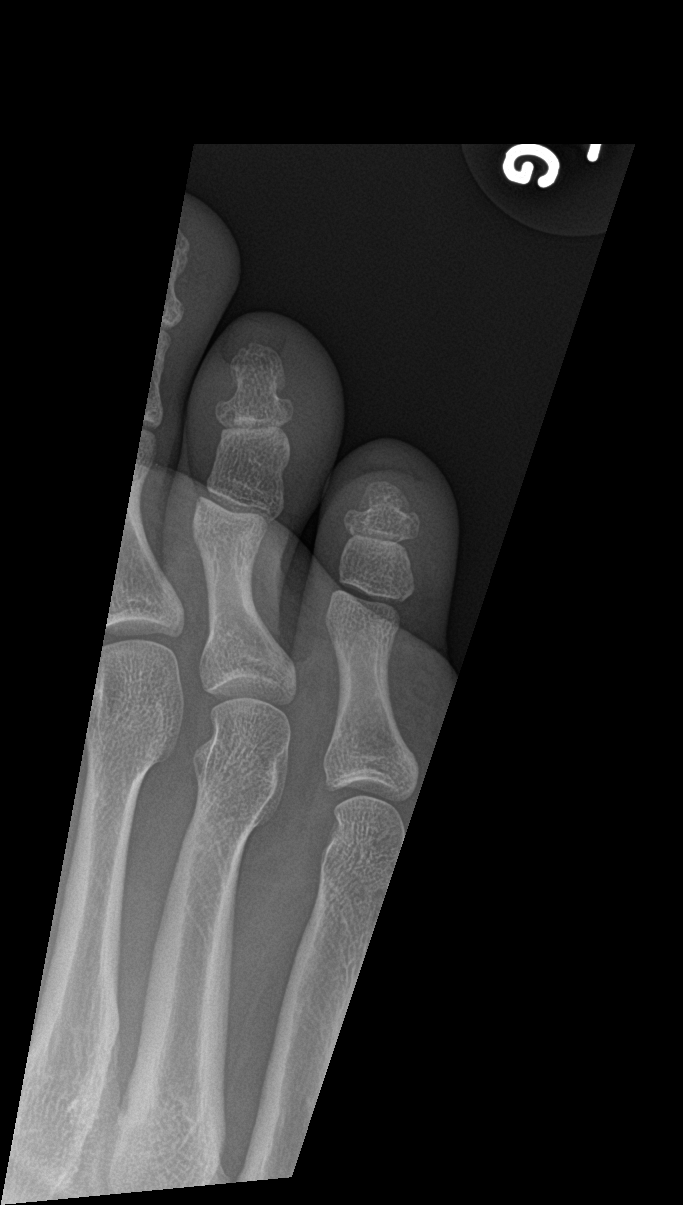

[toe lat]
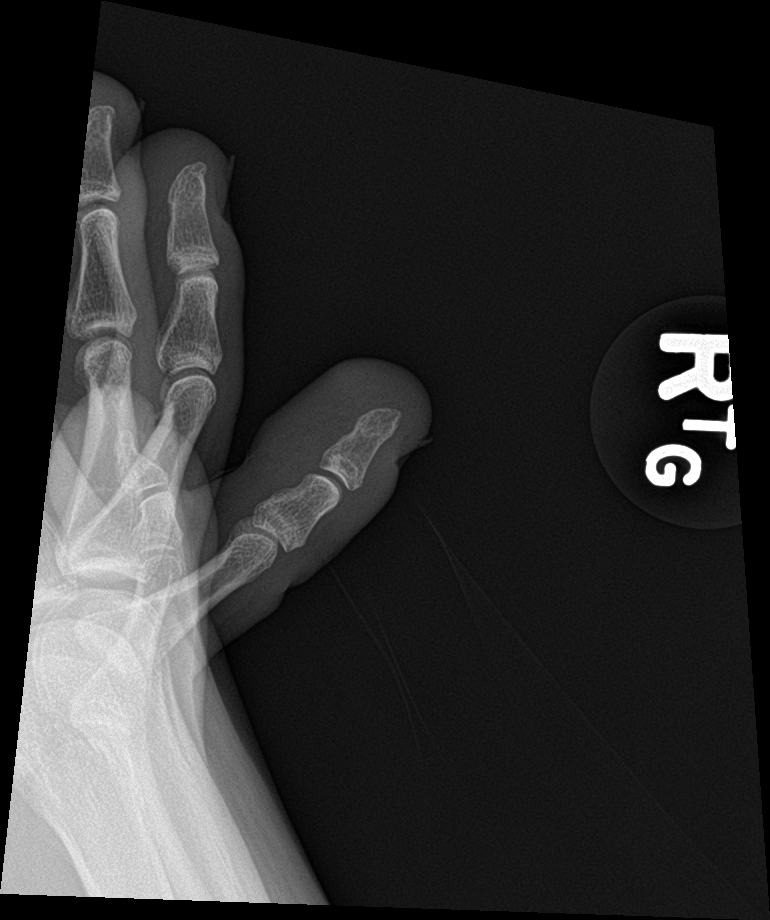

[3 of 3 positions shown; findings below may reference images not displayed]

FINDINGS: Frontal, oblique, and lateral views obtained. There is mild soft
tissue swelling. No fracture or dislocation. Joint spaces appear
normal. No appreciable erosive change.
IMPRESSION: Mild soft tissue swelling. No fracture or dislocation. No
appreciable arthropathy.

## 2021-06-29 ENCOUNTER — Ambulatory Visit (INDEPENDENT_AMBULATORY_CARE_PROVIDER_SITE_OTHER): Payer: BC Managed Care – PPO | Admitting: Sports Medicine

## 2021-06-29 VITALS — BP 132/84 | HR 78 | Ht 75.0 in | Wt 234.0 lb

## 2021-06-29 DIAGNOSIS — B356 Tinea cruris: Secondary | ICD-10-CM

## 2021-06-29 DIAGNOSIS — Z23 Encounter for immunization: Secondary | ICD-10-CM | POA: Diagnosis not present

## 2021-06-29 DIAGNOSIS — Z0000 Encounter for general adult medical examination without abnormal findings: Secondary | ICD-10-CM | POA: Diagnosis not present

## 2021-06-29 LAB — LIPID PANEL
Cholesterol: 198 mg/dL (ref <200)
HDL: 46 mg/dL (ref >=40)
LDL Cholesterol (Calc): 127 mg/dL (calc) — ABNORMAL HIGH
Non-HDL Cholesterol (Calc): 152 mg/dL (calc) — ABNORMAL HIGH (ref <130)
Total CHOL/HDL Ratio: 4.3 (calc) (ref <5.0)
Triglycerides: 134 mg/dL (ref <150)

## 2021-06-29 LAB — CBC
HCT: 45.7 % (ref 38.5–50.0)
Hemoglobin: 15.7 g/dL (ref 13.2–17.1)
MCH: 29.6 pg (ref 27.0–33.0)
MCHC: 34.4 g/dL (ref 32.0–36.0)
MCV: 86.2 fL (ref 80.0–100.0)
MPV: 11.1 fL (ref 7.5–12.5)
Platelets: 205 10*3/uL (ref 140–400)
RBC: 5.3 10*6/uL (ref 4.20–5.80)
RDW: 12.6 % (ref 11.0–15.0)
WBC: 4.9 10*3/uL (ref 3.8–10.8)

## 2021-06-29 LAB — COMPREHENSIVE METABOLIC PANEL
AG Ratio: 2.1 (calc) (ref 1.0–2.5)
ALT: 13 U/L (ref 9–46)
AST: 15 U/L (ref 10–40)
Albumin: 4.7 g/dL (ref 3.6–5.1)
Alkaline phosphatase (APISO): 70 U/L (ref 36–130)
BUN: 13 mg/dL (ref 7–25)
CO2: 31 mmol/L (ref 20–32)
Calcium: 9.5 mg/dL (ref 8.6–10.3)
Chloride: 104 mmol/L (ref 98–110)
Creat: 1.05 mg/dL (ref 0.60–1.24)
Globulin: 2.2 g/dL (calc) (ref 1.9–3.7)
Glucose, Bld: 94 mg/dL (ref 65–99)
Potassium: 4.8 mmol/L (ref 3.5–5.3)
Sodium: 141 mmol/L (ref 135–146)
Total Bilirubin: 0.6 mg/dL (ref 0.2–1.2)
Total Protein: 6.9 g/dL (ref 6.1–8.1)

## 2021-06-29 LAB — TSH: TSH: 0.89 mIU/L (ref 0.40–4.50)

## 2021-06-29 MED ORDER — FLUCONAZOLE 200 MG PO TABS: 200.0000 mg | ORAL_TABLET | ORAL | 0 refills | Status: AC

## 2021-06-29 MED ORDER — CLOTRIMAZOLE-BETAMETHASONE 1-0.05 % EX CREA: 1.0000 "application " | TOPICAL_CREAM | Freq: Two times a day (BID) | CUTANEOUS | 3 refills | Status: AC

## 2021-06-29 NOTE — Assessment & Plan Note (Signed)
Annual physical as above, checking routine labs, he will do the last HPV vaccine today. ?Return to see me in a year for this. ?

## 2021-06-29 NOTE — Progress Notes (Signed)
?Subjective:   ? ?CC: Annual Physical Exam ? ?HPI:  ?This patient is here for their annual physical ? ?I reviewed the past medical history, family history, social history, surgical history, and allergies today and no changes were needed.  Please see the problem list section below in epic for further details. ? ?Past Medical History: ?No past medical history on file. ?Past Surgical History: ?Past Surgical History:  ?Procedure Laterality Date  ? WISDOM TOOTH EXTRACTION    ? ?Social History: ?Social History  ? ?Socioeconomic History  ? Marital status: Married  ?  Spouse name: Not on file  ? Number of children: Not on file  ? Years of education: Not on file  ? Highest education level: Not on file  ?Occupational History  ? Not on file  ?Tobacco Use  ? Smoking status: Never  ? Smokeless tobacco: Former  ?  Types: Chew  ?Substance and Sexual Activity  ? Alcohol use: Yes  ?  Alcohol/week: 0.0 standard drinks  ? Drug use: No  ? Sexual activity: Yes  ?  Partners: Female  ?Other Topics Concern  ? Not on file  ?Social History Narrative  ? Not on file  ? ?Social Determinants of Health  ? ?Financial Resource Strain: Not on file  ?Food Insecurity: Not on file  ?Transportation Needs: Not on file  ?Physical Activity: Not on file  ?Stress: Not on file  ?Social Connections: Not on file  ? ?Family History: ?Family History  ?Problem Relation Age of Onset  ? Heart attack Other   ?     grandparents  ? Diabetes Other   ?     grandparents  ? Hyperlipidemia Other   ?     grandparents  ? Stroke Other   ?     grandparent   ? ?Allergies: ?No Known Allergies ?Medications: See med rec. ? ?Review of Systems: No headache, visual changes, nausea, vomiting, diarrhea, constipation, dizziness, abdominal pain, skin rash, fevers, chills, night sweats, swollen lymph nodes, weight loss, chest pain, body aches, joint swelling, muscle aches, shortness of breath, mood changes, visual or auditory hallucinations. ? ?Objective:   ? ?General: Well Developed,  well nourished, and in no acute distress.  ?Neuro: Alert and oriented x3, extra-ocular muscles intact, sensation grossly intact. Cranial nerves II through XII are intact, motor, sensory, and coordinative functions are all intact. ?HEENT: Normocephalic, atraumatic, pupils equal round reactive to light, neck supple, no masses, no lymphadenopathy, thyroid nonpalpable. Oropharynx, nasopharynx, external ear canals are unremarkable. ?Skin: Warm and dry, erythematous rash lower abdomen extending into groin, scaly leading edge. ?Cardiac: Regular rate and rhythm, no murmurs rubs or gallops.  ?Respiratory: Clear to auscultation bilaterally. Not using accessory muscles, speaking in full sentences.  ?Abdominal: Soft, nontender, nondistended, positive bowel sounds, no masses, no organomegaly.  ?Musculoskeletal: Shoulder, elbow, wrist, hip, knee, ankle stable, and with full range of motion. ? ?Impression and Recommendations:   ? ?The patient was counselled, risk factors were discussed, anticipatory guidance given. ? ?Annual physical exam ?Annual physical as above, checking routine labs, he will do the last HPV vaccine today. ?Return to see me in a year for this. ? ?Tinea cruris ?Ryett also has a rash in his lower abdomen extending into the groin, scrotum. ?There is a scaly leading edge, it is slightly pruritic all consistent with tinea cruris/jock itch. ?This is not an STD. ?He should do his [place] to keep the area dry and clean, he should wear underwear such as briefs or boxer briefs until  this is resolved. ?Considering how severe this is we will do both systemic and topical treatment with topical Lotrisone as well as fluconazole 200 mg weekly for 4 weeks. ?Return to see me in 6 weeks to see how things are going. ? ? ?___________________________________________ ?Ihor Austin. Benjamin Stain, M.D., ABFM., CAQSM. ?Primary Care and Sports Medicine ?[facility] ? ?Adjunct Professor of Family Medicine  ?[employer] ?

## 2021-06-29 NOTE — Addendum Note (Signed)
Addended by: Gaynelle Arabian on: 06/29/2021 09:42 AM ? ? Modules accepted: Orders ? ?

## 2021-06-29 NOTE — Assessment & Plan Note (Signed)
Loretta also has a rash in his lower abdomen extending into the groin, scrotum. ?There is a scaly leading edge, it is slightly pruritic all consistent with tinea cruris/jock itch. ?This is not an STD. ?He should do his [place] to keep the area dry and clean, he should wear underwear such as briefs or boxer briefs until this is resolved. ?Considering how severe this is we will do both systemic and topical treatment with topical Lotrisone as well as fluconazole 200 mg weekly for 4 weeks. ?Return to see me in 6 weeks to see how things are going. ?

## 2021-08-10 ENCOUNTER — Ambulatory Visit: Payer: BC Managed Care – PPO | Admitting: Sports Medicine

## 2023-10-20 ENCOUNTER — Encounter: Payer: Self-pay | Admitting: Urgent Care

## 2023-10-20 ENCOUNTER — Ambulatory Visit: Admitting: Urgent Care

## 2023-10-20 VITALS — BP 111/57 | HR 83 | Resp 18 | Ht 75.0 in | Wt 209.8 lb

## 2023-10-20 DIAGNOSIS — R3915 Urgency of urination: Secondary | ICD-10-CM | POA: Diagnosis not present

## 2023-10-20 LAB — POCT URINALYSIS DIP (CLINITEK)
Bilirubin, UA: NEGATIVE
Blood, UA: NEGATIVE
Glucose, UA: NEGATIVE mg/dL
Leukocytes, UA: NEGATIVE
Nitrite, UA: NEGATIVE
POC PROTEIN,UA: NEGATIVE
Spec Grav, UA: 1.025 (ref 1.010–1.025)
Urobilinogen, UA: 0.2 U/dL
pH, UA: 5.5 (ref 5.0–8.0)

## 2023-10-20 NOTE — Progress Notes (Signed)
 Established Patient Office Visit  Subjective:  Patient ID: Travis Ward, male    DOB: 15-Oct-1995  Age: 28 y.o. MRN: [id number]  Chief Complaint  Patient presents with   Urinary Frequency    Urinary Frequency  Associated symptoms include frequency.   Discussed the use of AI scribe software for clinical note transcription with the patient, who gave verbal consent to proceed.  History of Present Illness   Travis Ward is a 28 year old male who presents with increased urinary frequency and decreased urine output.  He has experienced increased urinary frequency and decreased urine output over the past three days. He needs to urinate more frequently, but with less volume than usual. This is a new symptom for him, and he feels 'something's off', although he does not experience any pain or significant discomfort. No hematuria, unusual odor, or changes in urine color. 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  No fever, chills, nausea, vomiting, or changes in bowel movements. He denies any history of kidney stones or family history of such. He has not started any new medications and has not taken any over-the-counter medications, including those containing Sudafed, in the past week. No discharge or concerns for sexually transmitted diseases, but he agrees to testing while at the clinic.  No upper back or flank pain. He also reports no sensation of sitting on a tennis ball, which could indicate prostatitis, and has no history of prostate issues.       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 No pertinent past medical history. Past Surgical History:  Procedure Laterality Date   WISDOM TOOTH EXTRACTION     Social History   Tobacco Use    Smoking status: Never   Smokeless tobacco: Former    Types: Chew  Substance Use Topics   Alcohol use: Yes    Alcohol/week: 0.0 standard drinks of alcohol   Drug use: No      ROS: as noted in HPI  Objective:     BP (!) 111/57   Pulse 83   Resp 18   Ht 6' 3 (1.905 m)   Wt 209 lb 12 oz (95.1 kg)   SpO2 100%   BMI 26.22 kg/m  BP Readings from Last 3 Encounters:  10/20/23 (!) 111/57  06/29/21 132/84  02/21/19 114/76   Wt Readings from Last 3 Encounters:  10/20/23 209 lb 12 oz (95.1 kg)  06/29/21 234 lb (106.1 kg)  02/21/19 202 lb (91.6 kg)      Physical Exam Vitals and nursing note reviewed.  Constitutional:      General: He is not in acute distress.    Appearance: Normal appearance. He is not ill-appearing, toxic-appearing or diaphoretic.  HENT:     Head: Normocephalic and atraumatic.     Right Ear: External ear normal.     Left Ear: External ear normal.     Nose: Nose normal.  Eyes:     General: No scleral icterus.       Right eye: No discharge.        Left eye: No discharge.     Pupils: Pupils are equal, round, and reactive to light.  Cardiovascular:     Rate and Rhythm:  Normal rate and regular rhythm.  Pulmonary:     Effort: Pulmonary effort is normal. No respiratory distress.     Breath sounds: No wheezing or rhonchi.  Abdominal:     General: Abdomen is flat.     Palpations: Abdomen is soft.     Tenderness: There is no right CVA tenderness or left CVA tenderness.  Skin:    General: Skin is warm and dry.     Findings: No erythema or rash.  Neurological:     General: No focal deficit present.     Mental Status: He is alert and oriented to person, place, and time.     Gait: Gait normal.  Psychiatric:        Mood and Affect: Mood normal.        Behavior: Behavior normal.      POCT URINALYSIS DIP (CLINITEK)  Result Value Ref Range   Color, UA yellow yellow   Clarity, UA clear clear   Glucose, UA negative negative mg/dL   Bilirubin, UA negative  negative   Ketones, POC UA trace (5) (A) negative mg/dL   Spec Grav, UA 8.974 8.989 - 1.025   Blood, UA negative negative   pH, UA 5.5 5.0 - 8.0   POC PROTEIN,UA negative negative, trace   Urobilinogen, UA 0.2 0.2 or 1.0 E.U./dL   Nitrite, UA Negative Negative   Leukocytes, UA Negative Negative    Last CBC Lab Results  Component Value Date   WBC 4.9 06/29/2021   HGB 15.7 06/29/2021   HCT 45.7 06/29/2021   MCV 86.2 06/29/2021   MCH 29.6 06/29/2021   RDW 12.6 06/29/2021   PLT 205 06/29/2021   Last metabolic panel Lab Results  Component Value Date   GLUCOSE 94 06/29/2021   NA 141 06/29/2021   K 4.8 06/29/2021   CL 104 06/29/2021   CO2 31 06/29/2021   BUN 13 06/29/2021   CREATININE 1.05 06/29/2021   GFRNONAA 101 02/26/2019   CALCIUM 9.5 06/29/2021   PROT 6.9 06/29/2021   BILITOT 0.6 06/29/2021   AST 15 06/29/2021   ALT 13 06/29/2021      The ASCVD Risk score (Arnett DK, et al., 2019) failed to calculate for the following reasons:   The 2019 ASCVD risk score is only valid for ages 29 to 58  Assessment & Plan:  Urgency of urination -     POCT URINALYSIS DIP (CLINITEK) -     Urine Microscopic -     GC/Chlamydia Probe Amp -     Urine Culture  Assessment and Plan    Urinary frequency and decreased urine volume Acute urinary frequency and decreased volume without pain or systemic symptoms. Differential includes dehydration, urethral irritation, UTI, or urinary crystals. Urine appears normal; further testing needed. - Send urine for culture to rule out bacterial infection. - Send urine for microanalysis to check for crystals. - Test for STDs using urine sample. - Advise increased water intake, avoid caffeine and alcohol for 24 hours. - Communicate test results and treatment plan via MyChart.         No follow-ups on file.   Benton LITTIE Gave, PA

## 2023-10-21 ENCOUNTER — Ambulatory Visit: Payer: Self-pay | Admitting: Urgent Care

## 2023-10-21 ENCOUNTER — Encounter: Payer: Self-pay | Admitting: Urgent Care

## 2023-10-21 LAB — URINALYSIS, MICROSCOPIC ONLY
Bacteria, UA: NONE SEEN
Casts: NONE SEEN /LPF
Epithelial Cells (non renal): NONE SEEN /HPF (ref 0–10)
RBC, Urine: NONE SEEN /HPF (ref 0–2)
WBC, UA: NONE SEEN /HPF (ref 0–5)

## 2023-10-22 LAB — URINE CULTURE: Organism ID, Bacteria: NO GROWTH

## 2023-10-23 LAB — GC/CHLAMYDIA PROBE AMP
Chlamydia trachomatis, NAA: NEGATIVE
Neisseria Gonorrhoeae by PCR: NEGATIVE

## 2023-10-27 ENCOUNTER — Ambulatory Visit (INDEPENDENT_AMBULATORY_CARE_PROVIDER_SITE_OTHER): Payer: Self-pay | Admitting: Sports Medicine

## 2023-10-27 VITALS — BP 110/61 | HR 67 | Ht 75.0 in | Wt 208.0 lb

## 2023-10-27 DIAGNOSIS — R739 Hyperglycemia, unspecified: Secondary | ICD-10-CM

## 2023-10-27 DIAGNOSIS — Z0000 Encounter for general adult medical examination without abnormal findings: Secondary | ICD-10-CM

## 2023-10-27 DIAGNOSIS — R350 Frequency of micturition: Secondary | ICD-10-CM

## 2023-10-27 DIAGNOSIS — F341 Dysthymic disorder: Secondary | ICD-10-CM

## 2023-10-27 NOTE — Assessment & Plan Note (Signed)
 He did see one of my partners recently, he was having some urinary frequency, urinalysis, microscopic, gonorrhea, chlamydia testing all normal. No discharge. This resolved on its own. He has a family history of overactive bladder. I suspect this may be the beginning of overactive bladder for him however if this recurs we will get another urinalysis and urine culture testing, and we will likely do a course of antibiotics to illuminate any prostatitis component before considering anticholinergic treatment of OAB.

## 2023-10-27 NOTE — Progress Notes (Signed)
 Subjective:    CC: Annual Physical Exam  HPI:  This patient is here for their annual physical  I reviewed the past medical history, family history, social history, surgical history, and allergies today and no changes were needed.  Please see the problem list section below in epic for further details.  Past Medical History: No past medical history on file. Past Surgical History: Past Surgical History:  Procedure Laterality Date   WISDOM TOOTH EXTRACTION     Social History: Social History   Socioeconomic History   Marital status: Married    Spouse name: Not on file   Number of children: Not on file   Years of education: Not on file   Highest education level: Not on file  Occupational History   Not on file  Tobacco Use   Smoking status: Never   Smokeless tobacco: Former    Types: Chew  Substance and Sexual Activity   Alcohol use: Yes    Alcohol/week: 0.0 standard drinks of alcohol   Drug use: No   Sexual activity: Yes    Partners: Female  Other Topics Concern   Not on file  Social History Narrative   Not on file   Social Drivers of Health   Financial Resource Strain: Not on file  Food Insecurity: Not on file  Transportation Needs: Not on file  Physical Activity: Not on file  Stress: Not on file  Social Connections: Unknown (07/17/2021)   Received from [facility]   Social Network    Social Network: Not on file   Family History: Family History  Problem Relation Age of Onset   Heart attack Other        grandparents   Diabetes Other        grandparents   Hyperlipidemia Other        grandparents   Stroke Other        grandparent    Allergies: No Known Allergies Medications: See med rec.  Review of Systems: No headache, visual changes, nausea, vomiting, diarrhea, constipation, dizziness, abdominal pain, skin rash, fevers, chills, night sweats, swollen lymph nodes, weight loss, chest pain, body aches, joint swelling, muscle aches, shortness of breath,  mood changes, visual or auditory hallucinations.  Objective:    General: Well Developed, well nourished, and in no acute distress.  Neuro: Alert and oriented x3, extra-ocular muscles intact, sensation grossly intact. Cranial nerves II through XII are intact, motor, sensory, and coordinative functions are all intact. HEENT: Normocephalic, atraumatic, pupils equal round reactive to light, neck supple, no masses, no lymphadenopathy, thyroid nonpalpable. Oropharynx, nasopharynx, external ear canals are unremarkable. Skin: Warm and dry, no rashes noted.  Cardiac: Regular rate and rhythm, no murmurs rubs or gallops.  Respiratory: Clear to auscultation bilaterally. Not using accessory muscles, speaking in full sentences.  Abdominal: Soft, nontender, nondistended, positive bowel sounds, no masses, no organomegaly.  Musculoskeletal: Shoulder, elbow, wrist, hip, knee, ankle stable, and with full range of motion.  Impression and Recommendations:    The patient was counselled, risk factors were discussed, anticipatory guidance given.  Annual physical exam Annual physical as above. Up-to-date on screenings, ordering routine labs.  Dysthymia Off of all medications, mood is good.  Urinary frequency He did see one of my partners recently, he was having some urinary frequency, urinalysis, microscopic, gonorrhea, chlamydia testing all normal. No discharge. This resolved on its own. He has a family history of overactive bladder. I suspect this may be the beginning of overactive bladder for him however if  this recurs we will get another urinalysis and urine culture testing, and we will likely do a course of antibiotics to illuminate any prostatitis component before considering anticholinergic treatment of OAB.   ____________________________________________ Debby PARAS. Curtis, M.D., ABFM., CAQSM., AME. Primary Care and Sports Medicine [facility]  Adjunct Professor of  [facility] Medicine  [employer]  Restaurant manager, fast food

## 2023-10-27 NOTE — Assessment & Plan Note (Signed)
 Annual physical as above. Up-to-date on screenings, ordering routine labs.

## 2023-10-27 NOTE — Assessment & Plan Note (Signed)
 Off of all medications, mood is good.

## 2023-10-31 DIAGNOSIS — R739 Hyperglycemia, unspecified: Secondary | ICD-10-CM | POA: Diagnosis not present

## 2023-11-01 LAB — COMPREHENSIVE METABOLIC PANEL WITH GFR
ALT: 12 IU/L (ref 0–44)
AST: 12 IU/L (ref 0–40)
Albumin: 4.5 g/dL (ref 4.3–5.2)
Alkaline Phosphatase: 62 IU/L (ref 44–121)
BUN/Creatinine Ratio: 14 (ref 9–20)
BUN: 15 mg/dL (ref 6–20)
Bilirubin Total: 0.4 mg/dL (ref 0.0–1.2)
CO2: 23 mmol/L (ref 20–29)
Calcium: 9.3 mg/dL (ref 8.7–10.2)
Chloride: 101 mmol/L (ref 96–106)
Creatinine, Ser: 1.06 mg/dL (ref 0.76–1.27)
Globulin, Total: 2 g/dL (ref 1.5–4.5)
Glucose: 81 mg/dL (ref 70–99)
Potassium: 4.3 mmol/L (ref 3.5–5.2)
Sodium: 139 mmol/L (ref 134–144)
Total Protein: 6.5 g/dL (ref 6.0–8.5)
eGFR: 99 mL/min/1.73 (ref >59)

## 2023-11-01 LAB — HEMOGLOBIN A1C
Est. average glucose Bld gHb Est-mCnc: 88 mg/dL
Hgb A1c MFr Bld: 4.7 % — ABNORMAL LOW (ref 4.8–5.6)

## 2023-11-01 LAB — LIPID PANEL
Chol/HDL Ratio: 3.9 ratio (ref 0.0–5.0)
Cholesterol, Total: 165 mg/dL (ref 100–199)
HDL: 42 mg/dL (ref >39)
LDL Chol Calc (NIH): 108 mg/dL — ABNORMAL HIGH (ref 0–99)
Triglycerides: 79 mg/dL (ref 0–149)
VLDL Cholesterol Cal: 15 mg/dL (ref 5–40)

## 2023-11-01 LAB — CBC
Hematocrit: 42 % (ref 37.5–51.0)
Hemoglobin: 14.4 g/dL (ref 13.0–17.7)
MCH: 30.4 pg (ref 26.6–33.0)
MCHC: 34.3 g/dL (ref 31.5–35.7)
MCV: 89 fL (ref 79–97)
Platelets: 208 x10E3/uL (ref 150–450)
RBC: 4.73 x10E6/uL (ref 4.14–5.80)
RDW: 12.3 % (ref 11.6–15.4)
WBC: 6 x10E3/uL (ref 3.4–10.8)

## 2023-11-01 LAB — TSH: TSH: 1.19 u[IU]/mL (ref 0.450–4.500)

## 2023-11-07 ENCOUNTER — Encounter: Payer: Self-pay | Admitting: Sports Medicine

## 2024-02-09 ENCOUNTER — Encounter: Payer: Self-pay | Admitting: Urgent Care

## 2024-02-12 NOTE — Telephone Encounter (Signed)
 Patient is scheduled for Tuesday December 9th

## 2024-02-13 ENCOUNTER — Ambulatory Visit: Admitting: Urgent Care

## 2024-02-14 ENCOUNTER — Encounter: Payer: Self-pay | Admitting: Urgent Care

## 2024-02-14 ENCOUNTER — Ambulatory Visit: Admitting: Urgent Care

## 2024-02-14 VITALS — BP 129/60 | HR 58 | Ht 75.0 in | Wt 212.0 lb

## 2024-02-14 DIAGNOSIS — R300 Dysuria: Secondary | ICD-10-CM

## 2024-02-14 DIAGNOSIS — N489 Disorder of penis, unspecified: Secondary | ICD-10-CM

## 2024-02-14 NOTE — Patient Instructions (Signed)
 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   RPR titer with 1:1 means prior infection, no active infection. Will repeat today. Will also repeat urine test and add trichomonas and mycoplasma screening.

## 2024-02-14 NOTE — Progress Notes (Signed)
 Established Patient Office Visit  Subjective:  Patient ID: Travis Ward, male    DOB: 1995/03/23  Age: 28 y.o. MRN: [id number]  Chief Complaint  Patient presents with   Exposure to STD    Exposure to STD    Discussed the use of AI scribe software for clinical note transcription with the patient, who gave verbal consent to proceed.  History of Present Illness   Travis Ward is a 28 year old male who presents with a positive syphilis test and a penile lesion.  He underwent a standard STD test through [facility], which returned positive for syphilis with a titer of 1:1. He has a history of syphilis that was treated years ago.  He has developed a raised, non-painful lesion on his penis. He experiences a general sense of discomfort and a slight change in sensation during urination, though it is not painful. No discharge, scrotal swelling, or testicular pain. He has never had a similar lesion before, even during his previous syphilis diagnosis, which was caught early and treated without symptoms.  He underwent a comprehensive STD panel, including tests for HIV, hepatitis, gonorrhea, chlamydia, and HSV, all of which returned negative results.       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 No pertinent past medical history. Past Surgical History:  Procedure Laterality Date   WISDOM TOOTH EXTRACTION     Social History   Tobacco Use   Smoking status: Never   Smokeless tobacco: Former    Types: Chew  Substance Use Topics   Alcohol use: Yes    Alcohol/week: 0.0 standard drinks of alcohol   Drug use: No      ROS: as noted in HPI  Objective:     BP 129/60   Pulse (!) 58   Ht 6' 3 (1.905 m)   Wt 212 lb (96.2 kg)   SpO2 100%   BMI 26.50 kg/m  BP Readings from Last 3 Encounters:  02/14/24 129/60   10/27/23 110/61  10/20/23 (!) 111/57   Wt Readings from Last 3 Encounters:  02/14/24 212 lb (96.2 kg)  10/27/23 208 lb (94.3 kg)  10/20/23 209 lb 12 oz (95.1 kg)      Physical Exam Vitals and nursing note reviewed. Exam conducted with a chaperone present.  Constitutional:      Appearance: Normal appearance. He is normal weight.  HENT:     Head: Normocephalic and atraumatic.  Cardiovascular:     Rate and Rhythm: Bradycardia present.  Pulmonary:     Effort: Pulmonary effort is normal. No respiratory distress.  Genitourinary:    Penis: Circumcised.      Testes: Normal.    Lymphadenopathy:     Lower Body: No right inguinal adenopathy. No left inguinal adenopathy.  Skin:    General: Skin is warm and dry.     Findings: Lesion present.  Neurological:     Mental Status: He is alert.      No results found for any visits on 02/14/24.  Last CBC Lab Results  Component Value Date   WBC 6.0 10/31/2023   HGB 14.4 10/31/2023   HCT 42.0 10/31/2023   MCV 89 10/31/2023   MCH 30.4 10/31/2023   RDW 12.3 10/31/2023   PLT 208 10/31/2023   Last metabolic panel Lab Results  Component Value Date  GLUCOSE 81 10/31/2023   NA 139 10/31/2023   K 4.3 10/31/2023   CL 101 10/31/2023   CO2 23 10/31/2023   BUN 15 10/31/2023   CREATININE 1.06 10/31/2023   EGFR 99 10/31/2023   CALCIUM 9.3 10/31/2023   PROT 6.5 10/31/2023   ALBUMIN 4.5 10/31/2023   LABGLOB 2.0 10/31/2023   BILITOT 0.4 10/31/2023   ALKPHOS 62 10/31/2023   AST 12 10/31/2023   ALT 12 10/31/2023      The ASCVD Risk score (Arnett DK, et al., 2019) failed to calculate for the following reasons:   The 2019 ASCVD risk score is only valid for ages 46 to 69  Assessment & Plan:  Penile lesion -     RPR W/RFLX TO RPR TITER, TREPONEMAL AB, SCREEN AND DIAGNOSIS -     Ct/GC/Tv NAA+Mycoplasmas Urine  Dysuria -     RPR W/RFLX TO RPR TITER, TREPONEMAL AB, SCREEN AND DIAGNOSIS -     Ct/GC/Tv NAA+Mycoplasmas  Urine  Assessment and Plan    Genital lesion Raised, 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 Will monitor. Considered a HSV swab however there was no vesicle or ulceration to swab. - Suspect benign, non-sti related lesion - Repeated syphilis titer to confirm no active infection. Reassurance provided, pt had a 1:1 titer last time it was checked in our office  Dysuria Recent negative test (last week). Sx onset new since those swabs performed. - repeat UA GC/chlam. Will add Trich/ mycoplasma        No follow-ups on file.   Benton LITTIE Gave, PA

## 2024-02-15 ENCOUNTER — Ambulatory Visit: Payer: Self-pay | Admitting: Urgent Care

## 2024-02-16 LAB — CT/GC/TV NAA+MYCOPLASMAS URINE
Chlamydia trachomatis, NAA: NEGATIVE
Mycoplasma genitalium NAA: NEGATIVE
Mycoplasma hominis NAA: NEGATIVE
Neisseria gonorrhoeae, NAA: NEGATIVE
Trich vag by NAA: NEGATIVE
Ureaplasma spp NAA: NEGATIVE

## 2024-02-16 LAB — RPR, QUANT+TP ABS (REFLEX)
Rapid Plasma Reagin, Quant: 1:1 {titer} — ABNORMAL HIGH
T Pallidum Abs: NONREACTIVE

## 2024-02-16 LAB — SYPHILIS: RPR W/REFLEX TO RPR TITER AND TREPONEMAL ANTIBODIES, TRADITIONAL SCREENING AND DIAGNOSIS ALGORITHM: RPR Ser Ql: REACTIVE — AB

## 2024-10-29 ENCOUNTER — Encounter: Admitting: Urgent Care

## 2024-10-29 ENCOUNTER — Encounter: Admitting: Sports Medicine
